# Patient Record
Sex: Female | Born: 1975 | Race: White | Hispanic: No | Marital: Single | Smoking: Current every day smoker
Health system: Southern US, Community
[De-identification: ages and names within clinical notes are randomized; demographics above are authoritative.]

## PROBLEM LIST (undated history)

## (undated) HISTORY — PX: KNEE SURGERY: SHX244

---

## 2006-10-04 ENCOUNTER — Emergency Department: Payer: Self-pay | Admitting: Emergency Medicine

## 2007-08-29 ENCOUNTER — Other Ambulatory Visit: Payer: Self-pay

## 2007-08-29 ENCOUNTER — Emergency Department: Payer: Self-pay | Admitting: Emergency Medicine

## 2008-07-30 ENCOUNTER — Ambulatory Visit: Payer: Self-pay | Admitting: Physician Assistant

## 2008-08-27 ENCOUNTER — Ambulatory Visit: Payer: Self-pay | Admitting: Orthopedic Surgery

## 2008-09-03 ENCOUNTER — Ambulatory Visit: Payer: Self-pay | Admitting: Orthopedic Surgery

## 2009-05-16 ENCOUNTER — Ambulatory Visit: Payer: Self-pay | Admitting: Unknown Physician Specialty

## 2009-07-02 ENCOUNTER — Ambulatory Visit: Payer: Self-pay

## 2011-12-25 ENCOUNTER — Ambulatory Visit: Payer: Self-pay | Admitting: Physician Assistant

## 2011-12-25 LAB — URINALYSIS, COMPLETE
Glucose,UR: NEGATIVE mg/dL (ref 0–75)
Ketone: NEGATIVE
Specific Gravity: 1.015 (ref 1.003–1.030)

## 2011-12-27 LAB — URINE CULTURE

## 2012-12-04 ENCOUNTER — Ambulatory Visit: Payer: Self-pay | Admitting: Podiatry

## 2013-11-27 ENCOUNTER — Ambulatory Visit: Payer: Self-pay | Admitting: Emergency Medicine

## 2013-11-29 ENCOUNTER — Ambulatory Visit: Payer: Self-pay | Admitting: Emergency Medicine

## 2013-12-04 LAB — WOUND CULTURE

## 2015-12-10 ENCOUNTER — Ambulatory Visit
Admission: EM | Admit: 2015-12-10 | Discharge: 2015-12-10 | Disposition: A | Discharge status: Home / Self Care | Payer: Self-pay | Attending: Family Medicine | Admitting: Family Medicine

## 2015-12-10 ENCOUNTER — Ambulatory Visit (INDEPENDENT_AMBULATORY_CARE_PROVIDER_SITE_OTHER): Payer: Self-pay

## 2015-12-10 ENCOUNTER — Encounter: Payer: Self-pay | Admitting: *Deleted

## 2015-12-10 DIAGNOSIS — S93602A Unspecified sprain of left foot, initial encounter: Secondary | ICD-10-CM

## 2015-12-10 DIAGNOSIS — S93402A Sprain of unspecified ligament of left ankle, initial encounter: Secondary | ICD-10-CM

## 2015-12-10 NOTE — ED Triage Notes (Signed)
Patient was running this AM during a exercise class and rolled her left ankle.

## 2015-12-10 NOTE — ED Provider Notes (Signed)
[facility] ____________________________________________  Time seen: Approximately 8:17 PM  I have reviewed the triage vital signs and the nursing notes.   HISTORY  Chief Complaint Foot Injury (left)   HPI Hannah Mayo is a 40 y.o. female presents for complaint of left foot and ankle pain. Patient reports that today she was riding and rolled her foot, proceeded to go to the boot camp Class with a lot of repetitive movements. Reports this evening she began having pain. Patient reports she has remained active all day, and reports pain started as soon as she actually rested her foot. Reports minimal pain at rest, majority of pain was with weightbearing and active walking. Reports had crutches at home and has been using those this evening. Denies any pain radiation, paresthesias, other injury. Denies fall to the ground, head injury or loss consciousness. Denies any other complaints. Denies recent sickness. Denies history of similar in left foot in the past.  No LMP recorded. Patient has had an injection. Declines pregnancy.  History reviewed. No pertinent past medical history.  There are no active problems to display for this patient.   Past Surgical History:  Procedure Laterality Date  . KNEE SURGERY Right       No current facility-administered medications for this encounter.  No current outpatient prescriptions on file.  Allergies Bee venom and Sulfa antibiotics  History reviewed. No pertinent family history.  Social History Social History  Substance Use Topics  . Smoking status: Current Every Day Smoker  . Smokeless tobacco: Never Used  . Alcohol use No    Review of Systems Constitutional: No fever/chills Eyes: No visual changes. ENT: No sore throat. Cardiovascular: Denies chest pain. Respiratory: Denies shortness of breath. Gastrointestinal: No abdominal pain.  No nausea, no vomiting.  No diarrhea.  No constipation. Genitourinary: Negative for  dysuria. Musculoskeletal: Negative for back pain. As above.  Skin: Negative for rash. Neurological: Negative for headaches, focal weakness or numbness.  10-point ROS otherwise negative.  ____________________________________________   PHYSICAL EXAM:  VITAL SIGNS: ED Triage Vitals  Enc Vitals Group     BP 12/10/15 1933 135/85     Pulse Rate 12/10/15 1933 92     Resp 12/10/15 1933 16     Temp 12/10/15 1933 98.1 F (36.7 C)     Temp Source 12/10/15 1933 Oral     SpO2 12/10/15 1933 100 %     Weight 12/10/15 1934 150 lb (68 kg)     Height 12/10/15 1934 5\' 6"  (1.676 m)     Head Circumference --      Peak Flow --      Pain Score 12/10/15 1938 7     Pain Loc --      Pain Edu? --      Excl. in GC? --     Constitutional: Alert and oriented. Well appearing and in no acute distress. Eyes: Conjunctivae are normal. PERRL. EOMI. ENT      Head: Normocephalic and atraumatic.      Mouth/Throat: Mucous membranes are moist. Cardiovascular: Normal rate, regular rhythm. Grossly normal heart sounds.  Good peripheral circulation. Respiratory: Normal respiratory effort without tachypnea nor retractions. Breath sounds are clear and equal bilaterally. No wheezes/rales/rhonchi. Gastrointestinal: Soft and nontender.  Musculoskeletal:No midline cervical, thoracic or lumbar tenderness to palpation. Bilateral pedal pulses equal and easily palpated.      Right lower leg:  No tenderness or edema.      Left lower leg:  No tenderness or edema. Except:  Left lateral malleolus mild tenderness to direct palpation, left anterior ankle mild tenderness to palpation dorsal mid to medial foot mild to moderate tenderness to palpation, no erythema, no edema, no ecchymosis, full range of motion, mild pain with left ankle rotation as well as plantar flexion and dosiflexion, normal sensation, normal distal capillary refill. No motor or tendon deficits to left foot. Left lower extremity otherwise nontender. Neurologic:   Normal speech and language. No gross focal neurologic deficits are appreciated. Speech is normal. No gait instability.  Skin:  Skin is warm, dry and intact. No rash noted. Psychiatric: Mood and affect are normal. Speech and behavior are normal. Patient exhibits appropriate insight and judgment   ___________________________________________   LABS (all labs ordered are listed, but only abnormal results are displayed)  Labs Reviewed - No data to display ____________________________________________  RADIOLOGY  Dg Ankle Complete Left  Result Date: 12/10/2015 CLINICAL DATA:  Ankle pain status post injury. Rolled ankle earlier today. EXAM: LEFT ANKLE COMPLETE - 3+ VIEW COMPARISON:  None. FINDINGS: There is soft tissue swelling adjacent to the lateral malleolus. The ankle is located. No acute fracture is identified. IMPRESSION: Soft tissue swelling adjacent to the lateral malleolus. No acute fracture identified. Electronically Signed   By: Britta MccreedySusan  Turner M.D.   On: 12/10/2015 20:22   Dg Foot Complete Left  Result Date: 12/10/2015 CLINICAL DATA:  Left foot injury. Dorsal and medial foot pain. Initial encounter. EXAM: LEFT FOOT - COMPLETE 3+ VIEW COMPARISON:  07/17/2012 FINDINGS: There is no evidence of fracture or dislocation. There is no evidence of arthropathy or other focal bone abnormality. Soft tissues are unremarkable. IMPRESSION: Negative. Electronically Signed   By: Myles RosenthalJohn  Stahl M.D.   On: 12/10/2015 20:22   ____________________________________________   PROCEDURES Procedures   Ace wrap applied by RN. ____________________________________________   INITIAL IMPRESSION / ASSESSMENT AND PLAN / ED COURSE  Pertinent labs & imaging results that were available during my care of the patient were reviewed by me and considered in my medical decision making (see chart for details).  Well-appearing patient. No acute distress. Presents for complaints of left ankle and left foot pain post  mechanical injury earlier today. Denies any pain or injury. Left foot and left ankle x-ray negative per radiology for acute bony abnormality. Suspect sprain injury. Encouraged supportive care. Ace wrap applied by RN. Patient has crutches at home. Patient declined need for prescription medication, and reports she'll take home ibuprofen. Information given for orthopedic as needed for continued pain. Declines need for work note.  Discussed follow up with Primary care physician this week. Discussed follow up and return parameters including no resolution or any worsening concerns. Patient verbalized understanding and agreed to plan.   ____________________________________________   FINAL CLINICAL IMPRESSION(S) / ED DIAGNOSES  Final diagnoses:  Sprain of left ankle, unspecified ligament, initial encounter  Foot sprain, left, initial encounter     There are no discharge medications for this patient.   Note: This dictation was prepared with Dragon dictation along with smaller phrase technology. Any transcriptional errors that result from this process are unintentional.    Clinical Course     Renford DillsLindsey Haila Dena, NP 12/10/15 2049

## 2015-12-10 NOTE — Discharge Instructions (Signed)
Rest, ice, elevate. Gradually apply weight as tolerated.  Follow up with your primary care physician this week as needed. Return to Urgent care for new or worsening concerns.

## 2016-05-10 ENCOUNTER — Ambulatory Visit (INDEPENDENT_AMBULATORY_CARE_PROVIDER_SITE_OTHER): Payer: Self-pay

## 2016-05-10 DIAGNOSIS — Z3042 Encounter for surveillance of injectable contraceptive: Secondary | ICD-10-CM

## 2016-05-10 MED ORDER — MEDROXYPROGESTERONE ACETATE 150 MG/ML IM SUSP
150.0000 mg | Freq: Once | INTRAMUSCULAR | Status: AC
  Administered 2016-05-10: 150 mg via INTRAMUSCULAR

## 2016-08-05 ENCOUNTER — Telehealth: Payer: Self-pay

## 2016-08-05 NOTE — Telephone Encounter (Signed)
Pt needs a refill on depo, her appt for her shot is 08/06/16, I called pt to get her to schedule a annual no answer, and voice mail box full

## 2016-08-06 ENCOUNTER — Other Ambulatory Visit: Payer: Self-pay | Admitting: Advanced Practice Midwife

## 2016-08-06 ENCOUNTER — Ambulatory Visit (INDEPENDENT_AMBULATORY_CARE_PROVIDER_SITE_OTHER): Payer: Self-pay

## 2016-08-06 DIAGNOSIS — Z308 Encounter for other contraceptive management: Secondary | ICD-10-CM

## 2016-08-06 DIAGNOSIS — Z3042 Encounter for surveillance of injectable contraceptive: Secondary | ICD-10-CM

## 2016-08-06 MED ORDER — MEDROXYPROGESTERONE ACETATE 150 MG/ML IM SUSP: 150.0000 mg | Freq: Once | INTRAMUSCULAR | 0 refills | Status: DC

## 2016-08-06 MED ORDER — MEDROXYPROGESTERONE ACETATE 150 MG/ML IM SUSP
150.0000 mg | Freq: Once | INTRAMUSCULAR | Status: AC
  Administered 2016-08-06: 150 mg via INTRAMUSCULAR

## 2016-08-06 NOTE — Progress Notes (Signed)
Pt here for depo which was given IM right glut.  NDC# [address]-2 

## 2016-08-06 NOTE — Telephone Encounter (Signed)
Please call and schedule annual.

## 2016-08-06 NOTE — Telephone Encounter (Signed)
Called and left voicemail for patient to call to be reschedule.

## 2016-08-27 ENCOUNTER — Encounter: Payer: Self-pay | Admitting: Advanced Practice Midwife

## 2016-08-27 ENCOUNTER — Ambulatory Visit (INDEPENDENT_AMBULATORY_CARE_PROVIDER_SITE_OTHER): Payer: Self-pay | Admitting: Advanced Practice Midwife

## 2016-08-27 VITALS — BP 118/72 | Ht 66.0 in | Wt 152.0 lb

## 2016-08-27 DIAGNOSIS — Z3042 Encounter for surveillance of injectable contraceptive: Secondary | ICD-10-CM

## 2016-08-27 DIAGNOSIS — Z01419 Encounter for gynecological examination (general) (routine) without abnormal findings: Secondary | ICD-10-CM

## 2016-08-27 MED ORDER — MEDROXYPROGESTERONE ACETATE 150 MG/ML IM SUSP: 150.0000 mg | INTRAMUSCULAR | 3 refills | Status: DC

## 2016-08-27 NOTE — Progress Notes (Signed)
Patient ID: Hannah Mayo, female   DOB: 01/07/1976, 41 y.o.   MRN: [id number]     Gynecology Annual Exam  PCP: Patient, No Pcp Per  Chief Complaint:  Chief Complaint  Patient presents with  . Annual Exam    History of Present Illness: Patient is a 41 y.o. G0P0000 presents for annual exam. The patient has no complaints today. She is asked about readiness for smoking cessation and states that at this time she is not ready to quit.  LMP: No LMP recorded. Patient has had an injection. Average Interval: irregular, some spotting for 2-3 weeks prior to next injection Duration of flow: NA Heavy Menses: not applicable Clots: no Intermenstrual Bleeding: no Postcoital Bleeding: no Dysmenorrhea: no  The patient is sexually active. She currently uses Depo-Provera injections for contraception. She denies dyspareunia.  The patient does occasionally perform self breast exams.  There is no notable family history of breast or ovarian cancer in her family.  The patient wears seatbelts: yes.   The patient has regular exercise: yes.  She admits to healthy diet and adequate hydration. She is an 18 year 1/2 to 1 PPD cigarette smoker.  The patient denies current symptoms of depression.    Review of Systems: Review of Systems  Constitutional: Negative.   HENT: Negative.   Eyes: Negative.   Respiratory: Negative.   Cardiovascular: Negative.   Gastrointestinal: Negative.   Genitourinary: Negative.   Musculoskeletal: Negative.   Skin: Negative.   Neurological: Negative.   Endo/Heme/Allergies: Negative.   Psychiatric/Behavioral: Negative.     Past Medical History:  History reviewed. No pertinent past medical history.  Past Surgical History:  Past Surgical History:  Procedure Laterality Date  . KNEE SURGERY Right     Gynecologic History:  No LMP recorded. Patient has had an injection. Contraception: Depo-Provera injections Last Pap: Results were: no abnormalities   Obstetric History:  G0P0000  Family History:  Family History  Problem Relation Age of Onset  . Breast cancer Maternal Grandmother   . Bone cancer Maternal Grandmother     Social History:  Social History   Social History  . Marital status: Single    Spouse name: N/A  . Number of children: N/A  . Years of education: N/A   Occupational History  . Not on file.   Social History Main Topics  . Smoking status: Current Every Day Smoker  . Smokeless tobacco: Never Used  . Alcohol use No  . Drug use: No  . Sexual activity: Yes    Birth control/ protection: Injection   Other Topics Concern  . Not on file   Social History Narrative  . No narrative on file    Allergies:  Allergies  Allergen Reactions  . Bee Venom Anaphylaxis  . Sulfa Antibiotics Hives    Medications: Prior to Admission medications   Medication Sig Start Date End Date Taking? Authorizing Provider  medroxyPROGESTERone (DEPO-PROVERA) 150 MG/ML injection Inject 1 mL (150 mg total) into the muscle every 3 (three) months. 08/27/16   Tresea MallGledhill, Seynabou Fults, CNM    Physical Exam Vitals: Blood pressure 118/72, height 5\' 6"  (1.676 m), weight 152 lb (68.9 kg).  General: NAD HEENT: normocephalic, anicteric Thyroid: no enlargement, no palpable nodules Pulmonary: No increased work of breathing, CTAB Cardiovascular: RRR, distal pulses 2+ Breast: Breast symmetrical, no tenderness, no palpable nodules or masses, no skin or nipple retraction present, no nipple discharge.  No axillary or supraclavicular lymphadenopathy. Abdomen: NABS, soft, non-tender, non-distended.  Umbilicus without lesions.  No hepatomegaly, splenomegaly or masses palpable. No evidence of hernia  Genitourinary: Deferred for PAP screening interval and no gyn concerns Extremities: no edema, erythema, or tenderness Neurologic: Grossly intact Psychiatric: mood appropriate, affect full   Assessment: 41 y.o. G0P0000 Well Woman exam  Plan: Problem List Items Addressed This Visit     None    Visit Diagnoses    Well woman exam with routine gynecological exam    -  Primary   Encounter for surveillance of injectable contraceptive       Relevant Medications   medroxyPROGESTERone (DEPO-PROVERA) 150 MG/ML injection      1) STI screening was offered and declined  2) ASCCP guidelines and rational discussed.  Patient opts for every 3 years screening interval  3) Contraception - patient chooses to continue with Depo Provera  4) Routine healthcare maintenance including cholesterol, diabetes screening discussed managed by PCP  5) Follow up 1 year for routine annual exam   Tresea Mall, CNM

## 2016-10-26 ENCOUNTER — Other Ambulatory Visit: Payer: Self-pay | Admitting: Advanced Practice Midwife

## 2016-10-26 DIAGNOSIS — Z3042 Encounter for surveillance of injectable contraceptive: Secondary | ICD-10-CM

## 2016-11-09 ENCOUNTER — Other Ambulatory Visit: Payer: Self-pay | Admitting: Advanced Practice Midwife

## 2016-11-09 DIAGNOSIS — Z3042 Encounter for surveillance of injectable contraceptive: Secondary | ICD-10-CM

## 2016-11-11 NOTE — Telephone Encounter (Signed)
Please advise 

## 2016-11-11 NOTE — Telephone Encounter (Signed)
Refill sent.

## 2016-11-17 ENCOUNTER — Ambulatory Visit (INDEPENDENT_AMBULATORY_CARE_PROVIDER_SITE_OTHER): Payer: Self-pay

## 2016-11-17 DIAGNOSIS — Z3042 Encounter for surveillance of injectable contraceptive: Secondary | ICD-10-CM

## 2016-11-17 LAB — POCT URINE PREGNANCY: Preg Test, Ur: NEGATIVE

## 2016-11-17 MED ORDER — MEDROXYPROGESTERONE ACETATE 150 MG/ML IM SUSP
150.0000 mg | Freq: Once | INTRAMUSCULAR | Status: AC
  Administered 2016-11-17: 150 mg via INTRAMUSCULAR

## 2016-11-17 NOTE — Progress Notes (Unsigned)
Patient out of window for depo provera, urine pregnancy test negative.

## 2017-02-11 ENCOUNTER — Ambulatory Visit (INDEPENDENT_AMBULATORY_CARE_PROVIDER_SITE_OTHER): Payer: Self-pay

## 2017-02-11 DIAGNOSIS — Z3042 Encounter for surveillance of injectable contraceptive: Secondary | ICD-10-CM

## 2017-02-11 MED ORDER — MEDROXYPROGESTERONE ACETATE 150 MG/ML IM SUSP
150.0000 mg | Freq: Once | INTRAMUSCULAR | Status: AC
  Administered 2017-02-11: 150 mg via INTRAMUSCULAR

## 2017-03-07 ENCOUNTER — Encounter: Payer: Self-pay | Admitting: *Deleted

## 2017-03-07 ENCOUNTER — Ambulatory Visit
Admission: EM | Admit: 2017-03-07 | Discharge: 2017-03-07 | Disposition: A | Discharge status: Home / Self Care | Payer: Self-pay | Attending: Family Medicine | Admitting: Family Medicine

## 2017-03-07 DIAGNOSIS — B349 Viral infection, unspecified: Secondary | ICD-10-CM

## 2017-03-07 DIAGNOSIS — E0789 Other specified disorders of thyroid: Secondary | ICD-10-CM

## 2017-03-07 DIAGNOSIS — J029 Acute pharyngitis, unspecified: Secondary | ICD-10-CM

## 2017-03-07 LAB — RAPID STREP SCREEN (MED CTR MEBANE ONLY): STREPTOCOCCUS, GROUP A SCREEN (DIRECT): NEGATIVE

## 2017-03-07 NOTE — ED Triage Notes (Signed)
Headache, sore throat, stiff neck x1 week.

## 2017-03-07 NOTE — ED Provider Notes (Signed)
[facility]    CSN: [id number] Arrival date & time: 03/07/17  1006     History   Chief Complaint Chief Complaint  Patient presents with  . Sore Throat  . Headache  . Torticollis    HPI Hannah Mayo is a 42 y.o. female.   The history is provided by the patient.  URI  Presenting symptoms: fever and sore throat   Presenting symptoms: no congestion, no cough, no ear pain and no facial pain   Severity:  Moderate Onset quality:  Sudden Duration:  1 week Timing:  Constant Progression:  Unchanged Chronicity:  New Relieved by:  OTC medications Associated symptoms: headaches and neck pain   Associated symptoms: no wheezing   Risk factors: sick contacts   Risk factors: not elderly, no chronic cardiac disease, no chronic kidney disease, no chronic respiratory disease, no diabetes mellitus, no immunosuppression, no recent illness and no recent travel     History reviewed. No pertinent past medical history.  There are no active problems to display for this patient.   Past Surgical History:  Procedure Laterality Date  . KNEE SURGERY Right     OB History    Gravida Para Term Preterm AB Living   0 0 0 0 0 0   SAB TAB Ectopic Multiple Live Births   0 0 0 0 0       Home Medications    Prior to Admission medications   Medication Sig Start Date End Date Taking? Authorizing Provider  medroxyPROGESTERone (DEPO-PROVERA) 150 MG/ML injection Inject 1 mL (150 mg total) into the muscle every 3 (three) months. 08/27/16  Yes Tresea Mall, CNM  MedroxyPROGESTERone Acetate 150 MG/ML SUSY Inject 1 mL (150 mg total) into the muscle once. 11/11/16 11/11/16  Tresea Mall, CNM    Family History Family History  Problem Relation Age of Onset  . Healthy Mother   . Alcoholism Father   . CAD Father   . Breast cancer Maternal Grandmother   . Bone cancer Maternal Grandmother     Social History Social History   Tobacco Use  . Smoking status: Current Every Day Smoker    . Smokeless tobacco: Never Used  Substance Use Topics  . Alcohol use: No  . Drug use: No     Allergies   Bee venom and Sulfa antibiotics   Review of Systems Review of Systems  Constitutional: Positive for fever.  HENT: Positive for sore throat. Negative for congestion and ear pain.   Respiratory: Negative for cough and wheezing.   Musculoskeletal: Positive for neck pain.  Neurological: Positive for headaches.     Physical Exam Triage Vital Signs ED Triage Vitals  Enc Vitals Group     BP 03/07/17 1041 124/82     Pulse Rate 03/07/17 1041 (!) 101     Resp 03/07/17 1041 16     Temp 03/07/17 1041 98.7 F (37.1 C)     Temp Source 03/07/17 1041 Oral     SpO2 03/07/17 1041 100 %     Weight 03/07/17 1043 150 lb (68 kg)     Height 03/07/17 1043 5\' 6"  (1.676 m)     Head Circumference --      Peak Flow --      Pain Score --      Pain Loc --      Pain Edu? --      Excl. in GC? --    No data found.  Updated Vital Signs BP 124/82 (  BP Location: Left Arm)   Pulse (!) 101   Temp 98.7 F (37.1 C) (Oral)   Resp 16   Ht 5\' 6"  (1.676 m)   Wt 150 lb (68 kg)   SpO2 100%   BMI 24.21 kg/m   Visual Acuity Right Eye Distance:   Left Eye Distance:   Bilateral Distance:    Right Eye Near:   Left Eye Near:    Bilateral Near:     Physical Exam  Constitutional: She appears well-developed and well-nourished. No distress.  HENT:  Head: Normocephalic and atraumatic.  Right Ear: Tympanic membrane, external ear and ear canal normal.  Left Ear: Tympanic membrane, external ear and ear canal normal.  Nose: Mucosal edema and rhinorrhea present. No nose lacerations, sinus tenderness, nasal deformity, septal deviation or nasal septal hematoma. No epistaxis.  No foreign bodies. Right sinus exhibits maxillary sinus tenderness and frontal sinus tenderness. Left sinus exhibits maxillary sinus tenderness and frontal sinus tenderness.  Mouth/Throat: Uvula is midline and mucous membranes are  normal. Posterior oropharyngeal erythema present. No oropharyngeal exudate, posterior oropharyngeal edema or tonsillar abscesses. No tonsillar exudate.  Eyes: Conjunctivae and EOM are normal. Pupils are equal, round, and reactive to light. Right eye exhibits no discharge. Left eye exhibits no discharge. No scleral icterus.  Neck: Normal range of motion. Neck supple. No tracheal deviation present. Thyromegaly (slightly more enlarged on right side) present.  Cardiovascular: Normal rate, regular rhythm and normal heart sounds.  Pulmonary/Chest: Effort normal and breath sounds normal. No respiratory distress. She has no wheezes. She has no rales.  Lymphadenopathy:    She has no cervical adenopathy.  Skin: She is not diaphoretic.  Nursing note and vitals reviewed.    UC Treatments / Results  Labs (all labs ordered are listed, but only abnormal results are displayed) Labs Reviewed  RAPID STREP [facility]CREEN (NOT [facility])  CULTURE, GROUP A STREP [facility])    EKG  EKG Interpretation None       Radiology No results found.  Procedures Procedures (including critical care time)  Medications Ordered in UC Medications - No data to display   Initial Impression / Assessment and Plan / UC Course  I have reviewed the triage vital signs and the nursing notes.  Pertinent labs & imaging results that were available during my care of the patient were reviewed by me and considered in my medical decision making (see chart for details).       Final Clinical Impressions(s) / UC Diagnoses   Final diagnoses:  Viral pharyngitis  Thyroid fullness    ED Discharge Orders    None     1. Lab results and diagnosis reviewed with patient 2. Recommend supportive treatment with salt water gargles, otc analgesics prn for sore throat 3. Recommend patient follow up with PCP for recheck of thyroid and possible referral for further thyroid evaluation 4. Follow-up prn if symptoms worsen or don't  improve   Controlled Substance Prescriptions [place] Controlled Substance Registry consulted? Not Applicable   Payton Mccallumonty, Jeffey Janssen, MD 03/07/17 [phone]

## 2017-03-10 LAB — CULTURE, GROUP A STREP (THRC)

## 2017-05-04 ENCOUNTER — Ambulatory Visit (INDEPENDENT_AMBULATORY_CARE_PROVIDER_SITE_OTHER): Payer: Self-pay

## 2017-05-04 DIAGNOSIS — Z3042 Encounter for surveillance of injectable contraceptive: Secondary | ICD-10-CM

## 2017-05-04 MED ORDER — MEDROXYPROGESTERONE ACETATE 150 MG/ML IM SUSP
150.0000 mg | Freq: Once | INTRAMUSCULAR | Status: AC
  Administered 2017-05-04: 150 mg via INTRAMUSCULAR

## 2017-07-22 ENCOUNTER — Other Ambulatory Visit: Payer: Self-pay | Admitting: Advanced Practice Midwife

## 2017-07-22 DIAGNOSIS — Z3042 Encounter for surveillance of injectable contraceptive: Secondary | ICD-10-CM

## 2017-07-27 ENCOUNTER — Ambulatory Visit: Payer: Self-pay

## 2017-07-27 DIAGNOSIS — Z3042 Encounter for surveillance of injectable contraceptive: Secondary | ICD-10-CM

## 2017-07-27 MED ORDER — MEDROXYPROGESTERONE ACETATE 150 MG/ML IM SUSP
150.0000 mg | Freq: Once | INTRAMUSCULAR | Status: AC
  Administered 2017-07-27: 150 mg via INTRAMUSCULAR

## 2017-10-19 ENCOUNTER — Ambulatory Visit: Payer: Self-pay

## 2017-10-19 DIAGNOSIS — Z3042 Encounter for surveillance of injectable contraceptive: Secondary | ICD-10-CM

## 2017-10-19 MED ORDER — MEDROXYPROGESTERONE ACETATE 150 MG/ML IM SUSP
150.0000 mg | Freq: Once | INTRAMUSCULAR | Status: AC
  Administered 2017-10-19: 150 mg via INTRAMUSCULAR

## 2018-01-18 ENCOUNTER — Ambulatory Visit (INDEPENDENT_AMBULATORY_CARE_PROVIDER_SITE_OTHER): Payer: Self-pay

## 2018-01-18 DIAGNOSIS — Z3042 Encounter for surveillance of injectable contraceptive: Secondary | ICD-10-CM

## 2018-01-18 MED ORDER — MEDROXYPROGESTERONE ACETATE 150 MG/ML IM SUSP
150.0000 mg | Freq: Once | INTRAMUSCULAR | Status: AC
  Administered 2018-01-18: 150 mg via INTRAMUSCULAR

## 2018-04-04 ENCOUNTER — Other Ambulatory Visit: Payer: Self-pay | Admitting: Advanced Practice Midwife

## 2018-04-04 DIAGNOSIS — Z3042 Encounter for surveillance of injectable contraceptive: Secondary | ICD-10-CM

## 2018-04-19 ENCOUNTER — Other Ambulatory Visit: Payer: Self-pay

## 2018-04-19 ENCOUNTER — Ambulatory Visit (INDEPENDENT_AMBULATORY_CARE_PROVIDER_SITE_OTHER): Payer: Self-pay

## 2018-04-19 DIAGNOSIS — Z3042 Encounter for surveillance of injectable contraceptive: Secondary | ICD-10-CM

## 2018-04-19 MED ORDER — MEDROXYPROGESTERONE ACETATE 150 MG/ML IM SUSP
150.0000 mg | Freq: Once | INTRAMUSCULAR | Status: AC
  Administered 2018-04-19: 150 mg via INTRAMUSCULAR

## 2018-07-05 ENCOUNTER — Other Ambulatory Visit: Payer: Self-pay | Admitting: Advanced Practice Midwife

## 2018-07-05 DIAGNOSIS — Z3042 Encounter for surveillance of injectable contraceptive: Secondary | ICD-10-CM

## 2018-07-12 ENCOUNTER — Other Ambulatory Visit: Payer: Self-pay

## 2018-07-12 ENCOUNTER — Ambulatory Visit (INDEPENDENT_AMBULATORY_CARE_PROVIDER_SITE_OTHER): Payer: Self-pay

## 2018-07-12 DIAGNOSIS — Z3042 Encounter for surveillance of injectable contraceptive: Secondary | ICD-10-CM

## 2018-07-12 MED ORDER — MEDROXYPROGESTERONE ACETATE 150 MG/ML IM SUSP
150.0000 mg | Freq: Once | INTRAMUSCULAR | Status: AC
  Administered 2018-07-12: 150 mg via INTRAMUSCULAR

## 2018-07-12 NOTE — Progress Notes (Signed)
Patient presents today for Depo Provera injection within dates. Given IM LUOQ. Patient tolerated well. 

## 2018-10-02 ENCOUNTER — Other Ambulatory Visit: Payer: Self-pay | Admitting: Advanced Practice Midwife

## 2018-10-02 DIAGNOSIS — Z3042 Encounter for surveillance of injectable contraceptive: Secondary | ICD-10-CM

## 2018-10-03 ENCOUNTER — Telehealth: Payer: Self-pay

## 2018-10-03 NOTE — Telephone Encounter (Signed)
Pt called triage asking for a refill on her depo shot. I called the pt to advise her that we cannot refill her depo shot because she has not been seen for her annual since 09/04/16 and legally we are not allowed to refill her rx unless we see her since it has been so long. I have spoke with front desk, [place] and AMS to confirm that and they all sated we cannot send more depo until she comes in for her annual. I left a VM for the pt to call us back and schedule her annual and stated that we will refill the rx only if she has an annual scheduled but we cannot refill again if she does not show for that appt.

## 2020-05-09 ENCOUNTER — Encounter: Payer: Self-pay | Admitting: Emergency Medicine

## 2020-05-09 ENCOUNTER — Other Ambulatory Visit: Payer: Self-pay

## 2020-05-09 ENCOUNTER — Ambulatory Visit
Admission: EM | Admit: 2020-05-09 | Discharge: 2020-05-09 | Disposition: A | Discharge status: Home / Self Care | Payer: Self-pay | Attending: Physician Assistant | Admitting: Physician Assistant

## 2020-05-09 ENCOUNTER — Ambulatory Visit (INDEPENDENT_AMBULATORY_CARE_PROVIDER_SITE_OTHER): Payer: Self-pay

## 2020-05-09 DIAGNOSIS — M25512 Pain in left shoulder: Secondary | ICD-10-CM

## 2020-05-09 DIAGNOSIS — R0789 Other chest pain: Secondary | ICD-10-CM

## 2020-05-09 MED ORDER — CYCLOBENZAPRINE HCL 10 MG PO TABS: 10.0000 mg | ORAL_TABLET | Freq: Two times a day (BID) | ORAL | 0 refills | Status: AC | PRN

## 2020-05-09 MED ORDER — MELOXICAM 7.5 MG PO TABS: 15.0000 mg | ORAL_TABLET | Freq: Every day | ORAL | 0 refills | Status: AC

## 2020-05-09 NOTE — ED Provider Notes (Addendum)
[facility]    CSN: [id number] Arrival date & time: 05/09/20  0858      History   Chief Complaint Chief Complaint  Patient presents with  . 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  Today, she is presenting for left upper chest pain and left anterior as well as scapular chest pain x 2 weeks.  These areas are tender to touch.  She does have good range of motion of her shoulder without pain.  She says pain is aching and occasionally sharp. Not associated with increased pain on breathing, movement, or eating. Nothing makes pain worse.  Pain improves with NSAIDs and TENS unit. She says symptoms have improved a little over the past 2 days since using her TENS unit.  Patient denies ever having any specific injury.  She says she has a history of allover joint pains and has not received a diagnosis.  She believes she may have Ehlers-Danlos syndrome since her sister does, but she has not been formally diagnosed.  Patient denies any history of cardiopulmonary disease.  She denies any red flag signs or symptoms.  She denies fever, dizziness, weakness, syncope or presyncope, palpitations, shortness of breath or breathing difficulty, abdominal pain, nausea/vomiting.  Denies any recent sickness.  No cough, congestion or sore throat.  No other concerns.  HPI  History reviewed. No pertinent past medical history.  There are no problems to display for this patient.   Past Surgical History:  Procedure Laterality Date  . KNEE SURGERY Right     OB History    Gravida  0   Para  0   Term  0   Preterm  0   AB  0   Living  0     SAB  0   IAB  0   Ectopic  0   Multiple  0   Live Births  0            Home Medications    Prior to Admission medications   Medication Sig Start Date End Date Taking? Authorizing Provider  cyclobenzaprine (FLEXERIL) 10 MG tablet Take 1 tablet (10 mg total) by mouth 2 (two) times daily as needed for up to  10 days for muscle spasms. 05/09/20 05/19/20 Yes Shirlee LatchEaves, Jazlyn Tippens B, PA-C  meloxicam (MOBIC) 7.5 MG tablet Take 2 tablets (15 mg total) by mouth daily. 05/09/20 06/08/20 Yes Shirlee LatchEaves, Lauramae Kneisley B, PA-C  medroxyPROGESTERone Acetate 150 MG/ML SUSY INJECT 1 ML (150 MG TOTAL) INTO THE MUSCLE ONCE. (NEED ANNUAL EXAM PRIOR TO ANY FURTHER REFILLS) 07/05/18   Tresea MallGledhill, Jane, CNM    Family History Family History  Problem Relation Age of Onset  . Healthy Mother   . Hypertension Mother   . Alcoholism Father   . CAD Father   . Breast cancer Maternal Grandmother   . Bone cancer Maternal Grandmother     Social History Social History   Tobacco Use  . Smoking status: Current Every Day Smoker    Types: Cigarettes  . Smokeless tobacco: Never Used  Vaping Use  . Vaping Use: Never used  Substance Use Topics  . Alcohol use: No  . Drug use: No     Allergies   Bee venom and Sulfa antibiotics   Review of Systems Review of Systems  Constitutional: Negative for fatigue.  HENT: Negative for congestion, rhinorrhea and sore throat.   Respiratory: Negative for cough.   Cardiovascular: Positive for chest  pain.  Gastrointestinal: Negative for abdominal pain, nausea and vomiting.  Musculoskeletal: Positive for arthralgias. Negative for back pain, gait problem, joint swelling, myalgias, neck pain and neck stiffness.  Skin: Negative for rash.  Neurological: Negative for dizziness, weakness, numbness and headaches.     Physical Exam Triage Vital Signs ED Triage Vitals  Enc Vitals Group     BP 05/09/20 0910 125/87     Pulse Rate 05/09/20 0910 85     Resp 05/09/20 0910 14     Temp 05/09/20 0910 98.3 F (36.8 C)     Temp Source 05/09/20 0910 Oral     SpO2 05/09/20 0910 100 %     Weight 05/09/20 0907 147 lb (66.7 kg)     Height 05/09/20 0907 5\' 6"  (1.676 m)     Head Circumference --      Peak Flow --      Pain Score 05/09/20 0906 3     Pain Loc --      Pain Edu? --      Excl. in GC? --    No data  found.  Updated Vital Signs BP 125/87 (BP Location: Right Arm)   Pulse 85   Temp 98.3 F (36.8 C) (Oral)   Resp 14   Ht 5\' 6"  (1.676 m)   Wt 147 lb (66.7 kg)   LMP 04/18/2020 (Approximate)   SpO2 100%   BMI 23.73 kg/m       Physical Exam Vitals and nursing note reviewed.  Constitutional:      General: She is not in acute distress.    Appearance: Normal appearance. She is not ill-appearing, toxic-appearing or diaphoretic.  HENT:     Head: Normocephalic and atraumatic.     Nose: Nose normal.     Mouth/Throat:     Mouth: Mucous membranes are moist.     Pharynx: Oropharynx is clear.  Eyes:     General: No scleral icterus.       Right eye: No discharge.        Left eye: No discharge.     Conjunctiva/sclera: Conjunctivae normal.  Cardiovascular:     Rate and Rhythm: Normal rate and regular rhythm.     Heart sounds: Normal heart sounds.  Pulmonary:     Effort: Pulmonary effort is normal. No respiratory distress.     Breath sounds: Normal breath sounds. No wheezing, rhonchi or rales.  Chest:     Chest wall: Tenderness (TTP left superior pectoralis muscle) present.  Musculoskeletal:     Left shoulder: Tenderness (TTP anterior shoulder and biceps groove) present. No swelling or bony tenderness. Normal range of motion. Normal strength.     Cervical back: Normal range of motion and neck supple. No rigidity or tenderness.  Skin:    General: Skin is dry.  Neurological:     General: No focal deficit present.     Mental Status: She is alert. Mental status is at baseline.     Motor: No weakness.     Gait: Gait normal.  Psychiatric:        Mood and Affect: Mood normal.        Behavior: Behavior normal.        Thought Content: Thought content normal.      UC Treatments / Results  Labs (all labs ordered are listed, but only abnormal results are displayed) Labs Reviewed - No data to display  EKG   Radiology DG Chest 2 View  Result Date: 05/09/2020 CLINICAL DATA:  45 year old female left chest and shoulder pain for 2 weeks. EXAM: CHEST - 2 VIEW COMPARISON:  08/30/2007 FINDINGS: The mediastinal contours are within normal limits. No cardiomegaly. The lungs are clear bilaterally without evidence of focal consolidation, pleural effusion, or pneumothorax. No acute osseous abnormality. IMPRESSION: No acute cardiopulmonary process. Electronically Signed   By: Marliss Coots MD   On: 05/09/2020 09:49   DG Shoulder Left  Result Date: 05/09/2020 CLINICAL DATA:  45 year old female left shoulder pain for 2 weeks. EXAM: LEFT SHOULDER - 2+ VIEW COMPARISON:  None. FINDINGS: There is no evidence of fracture or dislocation. There is no evidence of arthropathy or other focal bone abnormality. Soft tissues are unremarkable. IMPRESSION: No acute fracture or malalignment. Electronically Signed   By: Marliss Coots MD   On: 05/09/2020 09:50    Procedures ED EKG  Date/Time: 05/09/2020 9:49 AM Performed by: Shirlee Latch, PA-C Authorized by: Shirlee Latch, PA-C   ECG reviewed by ED Physician in the absence of a cardiologist: yes   Previous ECG:    Previous ECG:  Unavailable Interpretation:    Interpretation: normal   Rate:    ECG rate:  85   ECG rate assessment: normal   Rhythm:    Rhythm: sinus rhythm   Ectopy:    Ectopy: none   QRS:    QRS axis:  Normal ST segments:    ST segments:  Normal T waves:    T waves: normal   Comments:     Normal sinus rhythm. Regular rate.   (including critical care time)  Medications Ordered in UC Medications - No data to display  Initial Impression / Assessment and Plan / UC Course  I have reviewed the triage vital signs and the nursing notes.  Pertinent labs & imaging results that were available during my care of the patient were reviewed by me and considered in my medical decision making (see chart for details).   45 y/o female presenting for 2-week history of atraumatic left-sided chest and shoulder pain.  The chest and  shoulder are tender to palpation.  Pain has improved in the last 2 days.  No reduced range of motion of the shoulder.  Chest pain not associated with breathing or eating.  No red flag signs or symptoms.  All vital signs are normal and stable.  She is overall well-appearing.  Exam significant for TTP of the chest and left shoulder.  Her chest is clear to auscultation and heart regular rate and rhythm.  EKG performed today shows normal sinus rhythm and regular rate.  Chest x-ray performed is within normal limits and so is the left shoulder x-ray.  I did independently interpret the EKG and review the chest x-ray and left shoulder x-ray.  Low risk and low concern for cardiopulmonary abnormality as clinical presentation is consistent with musculoskeletal chest and shoulder pain.  Treating at this time with meloxicam and cyclobenzaprine.  Encouraged her to continue her TENS unit and use of heat and ice for discomfort.  Advised to follow-up with PCP, Ortho or our department if not improving over the next 2 weeks.  I thoroughly reviewed ED red flag signs and symptoms as well as return precautions for chest pain.  Final Clinical Impressions(s) / UC Diagnoses   Final diagnoses:  Chest wall pain  Acute pain of left shoulder     Discharge Instructions     CHEST AND SHOULDER PAIN: Your EKG was normal.  I am not worried about a problem  with your heart.  The chest x-ray and shoulder x-ray were both normal.  No evidence of arthritis your shoulder and your heart size is normal and there is no apparent issue with your lungs based on the chest x-ray.   Your presentation is most consistent with musculoskeletal chest and shoulder pain.  I have stressed avoiding painful activities . Reviewed RICE guidelines. Use medications as directed, including NSAIDs. If no NSAIDs have been prescribed for you today, you may take Aleve or Motrin over the counter. May use Tylenol in between doses of NSAIDs.  Meloxicam is an NSAID  does not take any other NSAIDs with this.  Since you do have a history of acid reflux it is not a bad idea for you to take over-the-counter Tums, Zantac or Prilosec when you take this medication.  I have also sent a muscle relaxer for you to try.  This can make you little drowsy to do not take if you are going to drive or operate any heavy machinery.  Continue to use her TENS unit if you believe that is helping.  If no improvement in the next 1-2 weeks, f/u with PCP or return to our office for reexamination, and please feel free to call or return at any time for any questions or concerns you may have and we will be happy to help you!      CHEST PAIN RED FLAGS:  The main red flags to look out for that could mean a heart attack are; pain in the center of the chest, difficulty breathing, nausea, sweating, clammy skin, dizziness, pain radiating to the jaw/neck/arm, racing heart, etc. If any of those symptoms are present, you must immediately get to the ER.     ED Prescriptions    Medication Sig Dispense Auth. Provider   meloxicam (MOBIC) 7.5 MG tablet Take 2 tablets (15 mg total) by mouth daily. 60 tablet Eusebio Friendly B, PA-C   cyclobenzaprine (FLEXERIL) 10 MG tablet Take 1 tablet (10 mg total) by mouth 2 (two) times daily as needed for up to 10 days for muscle spasms. 20 tablet Gareth Morgan     PDMP not reviewed this encounter.   Shirlee Latch, PA-C 05/09/20 1016    Eusebio Friendly B, PA-C 05/09/20 1016

## 2020-05-09 NOTE — Discharge Instructions (Addendum)
CHEST AND SHOULDER PAIN: Your EKG was normal.  I am not worried about a problem with your heart.  The chest x-ray and shoulder x-ray were both normal.  No evidence of arthritis your shoulder and your heart size is normal and there is no apparent issue with your lungs based on the chest x-ray.   Your presentation is most consistent with musculoskeletal chest and shoulder pain.  I have stressed avoiding painful activities . Reviewed RICE guidelines. Use medications as directed, including NSAIDs. If no NSAIDs have been prescribed for you today, you may take Aleve or Motrin over the counter. May use Tylenol in between doses of NSAIDs.  Meloxicam is an NSAID does not take any other NSAIDs with this.  Since you do have a history of acid reflux it is not a bad idea for you to take over-the-counter Tums, Zantac or Prilosec when you take this medication.  I have also sent a muscle relaxer for you to try.  This can make you little drowsy to do not take if you are going to drive or operate any heavy machinery.  Continue to use her TENS unit if you believe that is helping.  If no improvement in the next 1-2 weeks, f/u with PCP or return to our office for reexamination, and please feel free to call or return at any time for any questions or concerns you may have and we will be happy to help you!      CHEST PAIN RED FLAGS:  The main red flags to look out for that could mean a heart attack are; pain in the center of the chest, difficulty breathing, nausea, sweating, clammy skin, dizziness, pain radiating to the jaw/neck/arm, racing heart, etc. If any of those symptoms are present, you must immediately get to the ER.

## 2020-05-09 NOTE — ED Triage Notes (Signed)
Patient c/o left upper chest pain and left shoulder pain for the past 2 weeks.  Patient denies any injury or fall.  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  Patient denies SOB.

## 2020-06-21 ENCOUNTER — Ambulatory Visit
Admission: EM | Admit: 2020-06-21 | Discharge: 2020-06-21 | Disposition: A | Discharge status: Home / Self Care | Payer: Self-pay | Attending: Physician Assistant | Admitting: Physician Assistant

## 2020-06-21 ENCOUNTER — Other Ambulatory Visit: Payer: Self-pay

## 2020-06-21 ENCOUNTER — Encounter: Payer: Self-pay | Admitting: Emergency Medicine

## 2020-06-21 DIAGNOSIS — M25512 Pain in left shoulder: Secondary | ICD-10-CM

## 2020-06-21 DIAGNOSIS — R0789 Other chest pain: Secondary | ICD-10-CM

## 2020-06-21 MED ORDER — PREDNISONE 10 MG PO TABS: ORAL_TABLET | ORAL | 0 refills | Status: DC

## 2020-06-21 MED ORDER — CYCLOBENZAPRINE HCL 10 MG PO TABS: 10.0000 mg | ORAL_TABLET | Freq: Two times a day (BID) | ORAL | 0 refills | Status: AC | PRN

## 2020-06-21 NOTE — ED Triage Notes (Addendum)
Patient c/o left sided chest pain that started over a month and has not gone away.  Patient denies SOB.  Patient denies injury or fall. Patient was seen on 05/09/20 at [facility] for this chest pain and had EKG and chest xray done.

## 2020-06-21 NOTE — Discharge Instructions (Signed)
Your EKG was normal again today.  Your exam and symptoms are consistent with what they were a month and a half ago.  Since you did have some improvement with the NSAIDs and muscle relaxers and they describe your pain, and still do some like a musculoskeletal problem.  Will try prednisone course at this time.  It is stronger than a nonsteroidal anti-inflammatory and should help.  I have also refilled the Flexeril.  I encourage you to follow-up with Ortho.  You can go to [facility] walk-in urgent care in [place] if you are not feeling any better after you finish the prednisone or if your symptoms worsen.  Also, consider following up with physical therapy.  Most offices do not require a referral.  Go to emergency department sooner for any severe acute worsening of symptoms.

## 2020-06-22 NOTE — ED Provider Notes (Signed)
[facility]    CSN: [id number] Arrival date & time: 06/21/20  1412      History   Chief Complaint Chief Complaint  Patient presents with  . Chest Pain    Left side    HPI Hannah Mayo is a 45 y.o. female returning to clinic today for 68-month history of left anterior chest pain and left shoulder pain.  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  Also had a normal EKG at that time.  Patient prescribed NSAIDs and a muscle relaxer.  She says that she has been taking those medications and using her TENS unit and symptoms do improve when she takes those but have not resolved.  Patient denies any worsening of symptoms and states that her pain is in the same place as it was before.  She says the pain is aching and occasionally sharp, as described before.  She says when she moves a certain way she can reproduce the pain.  She says that if she slouches and hunches forward that will sometimes increase the pain in her chest and shoulder.  Again, patient denies any specific injury.  Patient says that she has had a history of multiple joint pains in the past but never had it assessed.  Patient denies any red flag signs or symptoms that can.  She denies any palpitations, shortness of breath or difficulty breathing, dizziness, weakness, headaches, abdominal pain, nausea/vomiting.  No cough or congestion.  Patient does not have a PCP and thought to return here since she has not improved since her last visit.  HPI  History reviewed. No pertinent past medical history.  There are no problems to display for this patient.   Past Surgical History:  Procedure Laterality Date  . KNEE SURGERY Right     OB History    Gravida  0   Para  0   Term  0   Preterm  0   AB  0   Living  0     SAB  0   IAB  0   Ectopic  0   Multiple  0   Live Births  0            Home  Medications    Prior to Admission medications   Medication Sig Start Date End Date Taking? Authorizing Provider  cyclobenzaprine (FLEXERIL) 10 MG tablet Take 1 tablet (10 mg total) by mouth 2 (two) times daily as needed for up to 10 days for muscle spasms. 06/21/20 07/01/20 Yes Shirlee Latch, PA-C  predniSONE (DELTASONE) 10 MG tablet Take 6 tablets by mouth on the first day and decrease by 1 tablet daily until complete 06/21/20  Yes Eusebio Friendly B, PA-C  medroxyPROGESTERone Acetate 150 MG/ML SUSY INJECT 1 ML (150 MG TOTAL) INTO THE MUSCLE ONCE. (NEED ANNUAL EXAM PRIOR TO ANY FURTHER REFILLS) 07/05/18   Tresea Mall, CNM    Family History Family History  Problem Relation Age of Onset  . Healthy Mother   . Hypertension Mother   . Alcoholism Father   . CAD Father   . Breast cancer Maternal Grandmother   . Bone cancer Maternal Grandmother     Social History Social History   Tobacco Use  . Smoking status: Current Every Day Smoker    Types: Cigarettes  . Smokeless tobacco: Never Used  Vaping Use  .  Vaping Use: Never used  Substance Use Topics  . Alcohol use: No  . Drug use: No     Allergies   Bee venom and Sulfa antibiotics   Review of Systems Review of Systems  Constitutional: Negative for chills, diaphoresis, fatigue and fever.  HENT: Negative for congestion.   Respiratory: Negative for cough, shortness of breath and wheezing.   Cardiovascular: Positive for chest pain. Negative for palpitations and leg swelling.  Gastrointestinal: Negative for abdominal pain, nausea and vomiting.  Musculoskeletal: Positive for arthralgias and myalgias.  Skin: Negative for rash.  Neurological: Negative for dizziness, weakness and headaches.  Hematological: Negative for adenopathy.     Physical Exam Triage Vital Signs ED Triage Vitals  Enc Vitals Group     BP 06/21/20 1432 (!) 144/82     Pulse Rate 06/21/20 1432 89     Resp 06/21/20 1432 14     Temp 06/21/20 1432 98.7 F (37.1  C)     Temp Source 06/21/20 1432 Oral     SpO2 06/21/20 1432 100 %     Weight 06/21/20 1430 147 lb (66.7 kg)     Height 06/21/20 1430 5\' 6"  (1.676 m)     Head Circumference --      Peak Flow --      Pain Score 06/21/20 1429 3     Pain Loc --      Pain Edu? --      Excl. in GC? --    No data found.  Updated Vital Signs BP (!) 144/82 (BP Location: Left Arm)   Pulse 89   Temp 98.7 F (37.1 C) (Oral)   Resp 14   Ht 5\' 6"  (1.676 m)   Wt 147 lb (66.7 kg)   LMP 06/16/2020 (Approximate)   SpO2 100%   BMI 23.73 kg/m       Physical Exam Vitals and nursing note reviewed.  Constitutional:      General: She is not in acute distress.    Appearance: Normal appearance. She is not ill-appearing or toxic-appearing.  HENT:     Head: Normocephalic and atraumatic.  Eyes:     General: No scleral icterus.       Right eye: No discharge.        Left eye: No discharge.     Conjunctiva/sclera: Conjunctivae normal.  Cardiovascular:     Rate and Rhythm: Normal rate and regular rhythm.     Heart sounds: Normal heart sounds.  Pulmonary:     Effort: Pulmonary effort is normal. No respiratory distress.     Breath sounds: Normal breath sounds.  Chest:     Chest wall: Tenderness (TTP superior left pectoralis muscle and TTP anterior shoulder diffusely. Full ROM of left shoulder w/o pain) present.  Musculoskeletal:     Cervical back: Neck supple.  Skin:    General: Skin is dry.  Neurological:     General: No focal deficit present.     Mental Status: She is alert. Mental status is at baseline.     Motor: No weakness.     Gait: Gait normal.  Psychiatric:        Mood and Affect: Mood normal.        Behavior: Behavior normal.        Thought Content: Thought content normal.      UC Treatments / Results  Labs (all labs ordered are listed, but only abnormal results are displayed) Labs Reviewed - No data to display  EKG  Radiology No results found.  Procedures ED EKG  Date/Time:  06/22/2020 8:19 AM Performed by: Shirlee LatchEaves, Victory Dresden B, PA-C Authorized by: Shirlee LatchEaves, Vernel Langenderfer B, PA-C   ECG reviewed by ED Physician in the absence of a cardiologist: yes   Previous ECG:    Previous ECG:  Compared to current   Similarity:  No change Interpretation:    Interpretation: normal   Rate:    ECG rate:  82   ECG rate assessment: normal   Rhythm:    Rhythm: sinus rhythm   Ectopy:    Ectopy: none   QRS:    QRS axis:  Normal   QRS intervals:  Normal   QRS conduction: normal   ST segments:    ST segments:  Normal T waves:    T waves: normal   Comments:     Normal sinus rhythm, regular rate   (including critical care time)  Medications Ordered in UC Medications - No data to display  Initial Impression / Assessment and Plan / UC Course  I have reviewed the triage vital signs and the nursing notes.  Pertinent labs & imaging results that were available during my care of the patient were reviewed by me and considered in my medical decision making (see chart for details).   45 year old female returning for 3264-month history of left anterior chest and shoulder pain.  Patient seen previously in did have an EKG, chest x-ray and shoulder x-ray which were all normal.  Patient did have some improvement with NSAIDs and muscle relaxers but returns because she is still having symptoms.  No worsening of symptoms since her previous visit.  Vital signs are stable today.  Blood pressure slightly elevated at 144/82.  Another EKG was performed today which shows normal sinus rhythm and regular rate.  No changes from previous EKG.  Patient's clinical presentation/exam still consistent with musculoskeletal pain.  I have refilled the cyclobenzaprine.  We will try prednisone at this time.  Advised her she should ultimately follow-up with orthopedics and physical therapy for this.  Advised that she can contact most physical therapy offices and usually does not need a referral.  Advise she can go to [facility]  walk-in urgent care [place] for evaluation of not improved with the prednisone.  Encouraged her to continue with her TENS unit and use of heat or ice for the discomfort.  Thoroughly reviewed ED red flag signs and symptoms regarding chest pain with patient.   Final Clinical Impressions(s) / UC Diagnoses   Final diagnoses:  Chest wall pain  Acute pain of left shoulder     Discharge Instructions     Your EKG was normal again today.  Your exam and symptoms are consistent with what they were a month and a half ago.  Since you did have some improvement with the NSAIDs and muscle relaxers and they describe your pain, and still do some like a musculoskeletal problem.  Will try prednisone course at this time.  It is stronger than a nonsteroidal anti-inflammatory and should help.  I have also refilled the Flexeril.  I encourage you to follow-up with Ortho.  You can go to [facility] walk-in urgent care [place] if you are not feeling any better after you finish the prednisone or if your symptoms worsen.  Also, consider following up with physical therapy.  Most offices do not require a referral.  Go to emergency department sooner for any severe acute worsening of symptoms.    ED Prescriptions  Medication Sig Dispense Auth. Provider   predniSONE (DELTASONE) 10 MG tablet Take 6 tablets by mouth on the first day and decrease by 1 tablet daily until complete 21 tablet Eusebio Friendly B, PA-C   cyclobenzaprine (FLEXERIL) 10 MG tablet Take 1 tablet (10 mg total) by mouth 2 (two) times daily as needed for up to 10 days for muscle spasms. 20 tablet Gareth Morgan     PDMP not reviewed this encounter.   Shirlee Latch, PA-C 06/22/20 [phone]

## 2022-03-16 ENCOUNTER — Telehealth: Payer: Self-pay

## 2022-03-16 NOTE — Telephone Encounter (Signed)
Returned call to patient from VM regarding inquiry into LCS.  No answer and patient VM is full.  Patient is age 47 and would not be eligible for LCS LDCT.

## 2022-03-17 NOTE — Telephone Encounter (Signed)
Returned call to patient from new voicemail for LCS inquiry.  Explained to patient our guidelines for LCS are for patient's age 45 years and older, who also meet the pack years criteria for smoking history.  She would not be eligible for LDCT.  Patient states her PCP has recommended she call us.  Discussed that sometimes we get referrals that don't meet all the criteria due to there are multiple elements that need to be met in order to proceed with the LDCT.  Patient states she would be a self-pay but advised we are still bound by our LCS criteria to order a LDCT.  Suggested patient reach out to PCP who recommended the LDCT and they may be able to recommend an alternative CT scan for her if she wants to have this done. They would be her best source for recommending and ordering imaging or other testing.  Patient acknowledged understanding.

## 2022-05-28 IMAGING — CR DG CHEST 2V
2 series · 2 of 2 positions shown · non-contrast
Comparison: 08/30/2007

CLINICAL DATA: 44-year-old female left chest and shoulder pain for
2 weeks.

EXAM:
CHEST - 2 VIEW

[chest pa]
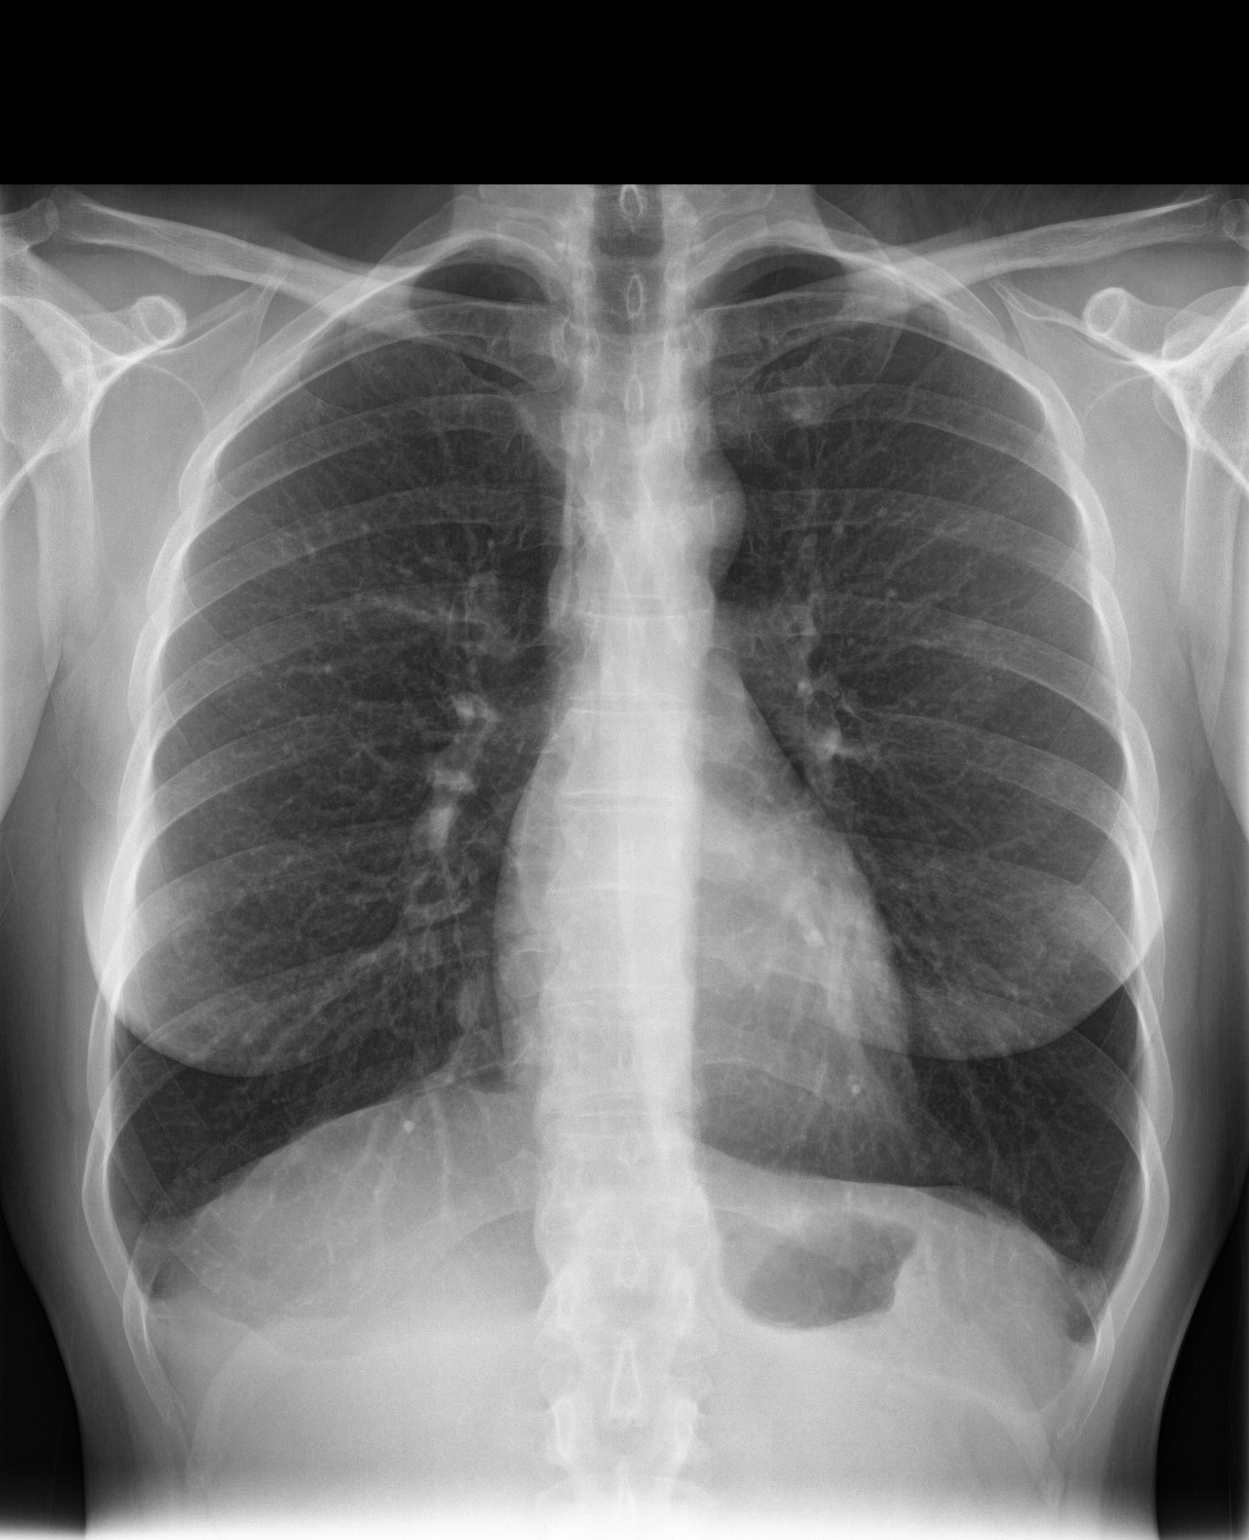

[chest lat]
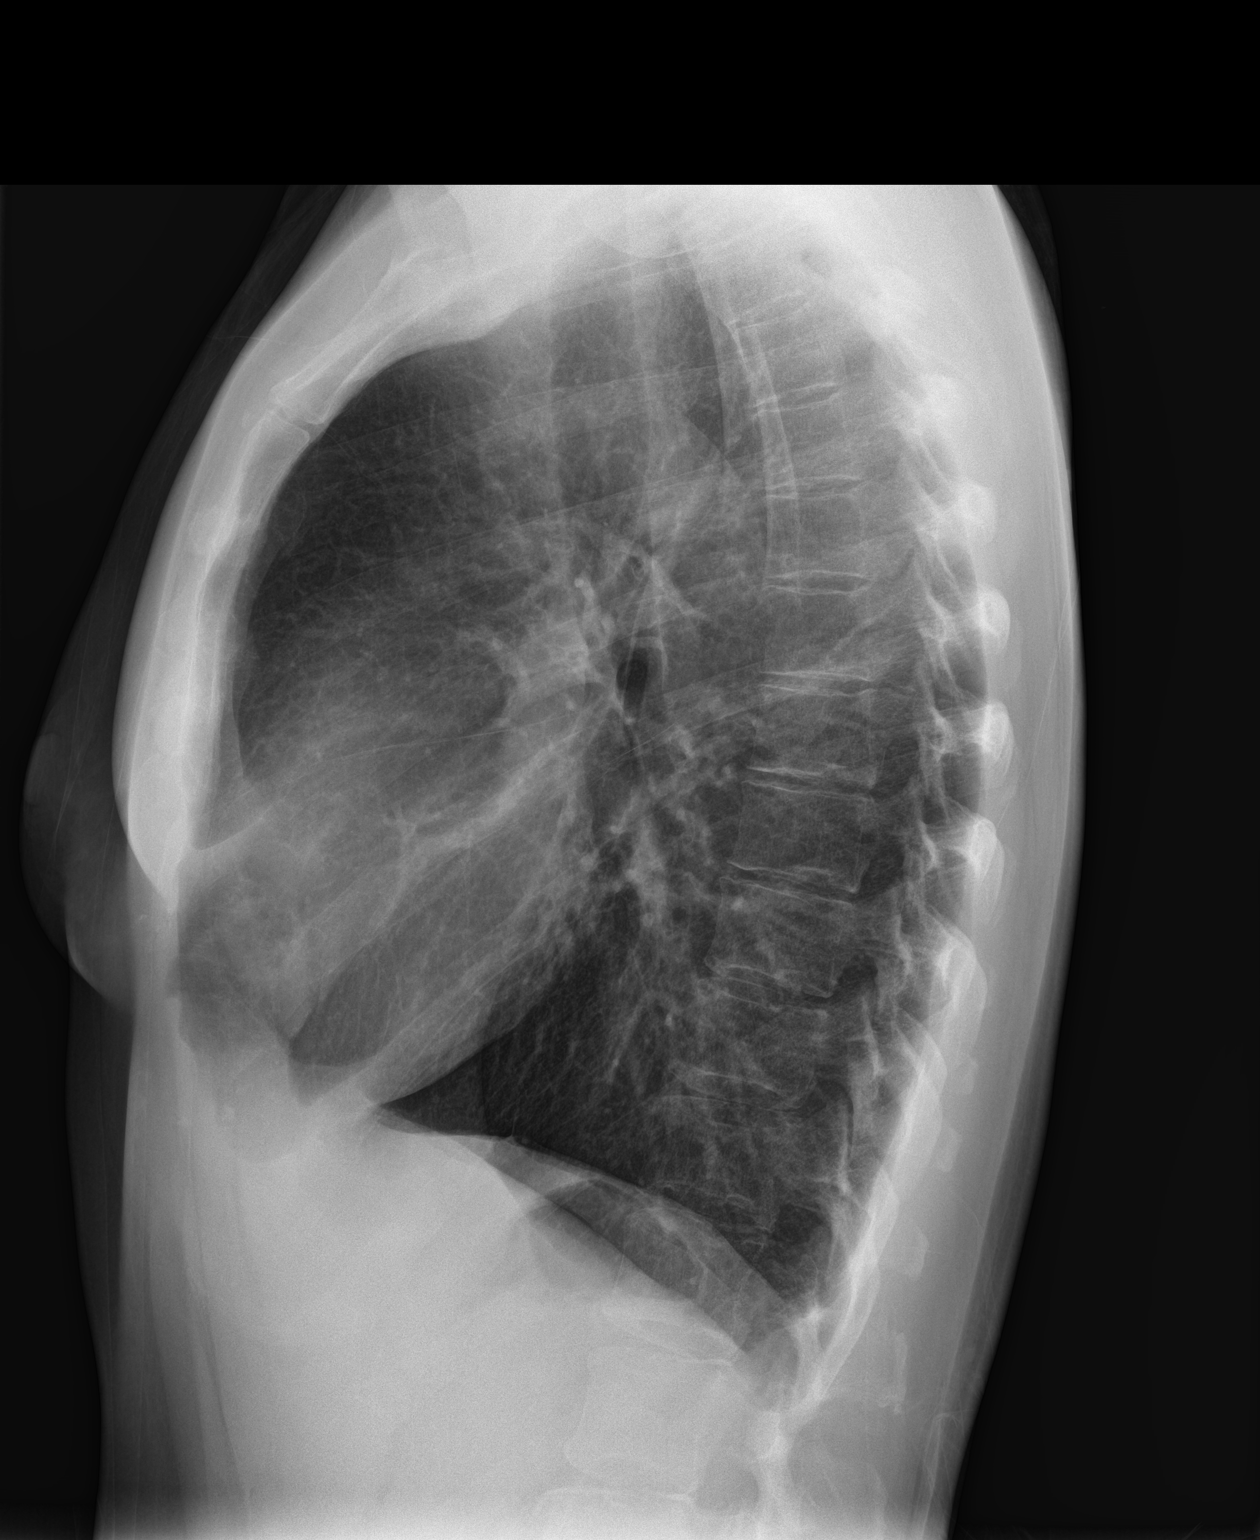

[2 of 2 positions shown; findings below may reference images not displayed]

FINDINGS: The mediastinal contours are within normal limits. No cardiomegaly.
The lungs are clear bilaterally without evidence of focal
consolidation, pleural effusion, or pneumothorax. No acute osseous
abnormality.
IMPRESSION: No acute cardiopulmonary process.

## 2023-10-06 ENCOUNTER — Ambulatory Visit (INDEPENDENT_AMBULATORY_CARE_PROVIDER_SITE_OTHER): Payer: Self-pay

## 2023-10-06 ENCOUNTER — Ambulatory Visit
Admission: RE | Admit: 2023-10-06 | Discharge: 2023-10-06 | Disposition: A | Discharge status: Home / Self Care | Payer: Self-pay | Attending: Family Medicine | Admitting: Family Medicine

## 2023-10-06 VITALS — BP 137/88 | HR 68 | Temp 98.2°F | Resp 16 | Wt 125.0 lb

## 2023-10-06 DIAGNOSIS — M79672 Pain in left foot: Secondary | ICD-10-CM

## 2023-10-06 DIAGNOSIS — S92512A Displaced fracture of proximal phalanx of left lesser toe(s), initial encounter for closed fracture: Secondary | ICD-10-CM

## 2023-10-06 NOTE — Discharge Instructions (Addendum)
 Take Tylenol or Motrin as needed for pain. Wear hard soled shoe for 4-6 weeks.  Follow up with foot and ankle specialist for fracture management.   Follow up with Dr Eva Gay  [address]  [facility] - Podiatry  [phone]   OR  Follow up with [facility] in [place].   Call [phone] [address] [place], [place] [address]

## 2023-10-06 NOTE — ED Provider Notes (Signed)
 [facility]    CSN: [id number] Arrival date & time: 10/06/23  0820      History   Chief Complaint Chief Complaint  Patient presents with   Foot Injury    Need X-ray to see if or what is possibly broken - Entered by patient    HPI  HPI Hannah Mayo is a 48 y.o. female.   Hannah Mayo presents for left foot pain that started 4 days ago.   She breaks her toes during martial arts all the time and this pain is worse.   Her 160 lb cane corso dog took off and my foot lagged behind.  She had immediate pain and now has bruising around the little toe.  Nothing taken for pain.      History reviewed. No pertinent past medical history.  There are no active problems to display for this patient.   Past Surgical History:  Procedure Laterality Date   KNEE SURGERY Right    KNEE SURGERY Left     OB History     Gravida  0   Para  0   Term  0   Preterm  0   AB  0   Living  0      SAB  0   IAB  0   Ectopic  0   Multiple  0   Live Births  0            Home Medications    Prior to Admission medications   Not on File    Family History Family History  Problem Relation Age of Onset   Healthy Mother    Hypertension Mother    Alcoholism Father    CAD Father    Breast cancer Maternal Grandmother    Bone cancer Maternal Grandmother     Social History Social History   Tobacco Use   Smoking status: Every Day    Types: Cigarettes   Smokeless tobacco: Never  Vaping Use   Vaping status: Never Used  Substance Use Topics   Alcohol use: No   Drug use: No     Allergies   Bee venom, Latex, and Sulfa antibiotics   Review of Systems Review of Systems: :negative unless otherwise stated in HPI.      Physical Exam Triage Vital Signs ED Triage Vitals  Encounter Vitals Group     BP      Girls Systolic BP Percentile      Girls Diastolic BP Percentile      Boys Systolic BP Percentile      Boys Diastolic BP Percentile      Pulse      Resp       Temp      Temp src      SpO2      Weight      Height      Head Circumference      Peak Flow      Pain Score      Pain Loc      Pain Education      Exclude from Growth Chart    No data found.  Updated Vital Signs BP 137/88 (BP Location: Right Arm)   Pulse 68   Temp 98.2 F (36.8 C) (Oral)   Resp 16   Wt 56.7 kg   LMP  (LMP Unknown)   SpO2 98%   BMI 20.18 kg/m   Visual Acuity Right Eye Distance:   Left Eye Distance:  Bilateral Distance:    Right Eye Near:   Left Eye Near:    Bilateral Near:     Physical Exam GEN: well appearing female in no acute distress  CVS: well perfused  RESP: speaking in full sentences without pause, no respiratory distress  MSK:   Ankle/Foot, Left: TTP noted at the distal 3rd-5th metatarsals and 4th and 5th toe. No visible erythema or bony deformity. + swelling, ecchymosis.  No notable pes planus deformity. Transverse arch grossly not intact;  No evidence of tibiotalar deviation; Range of motion is full in all directions. Strength is 5/5 in all directions. No tenderness at the insertion/body/myotendinous junction of the Achilles tendon; No tenderness on posterior aspects of lateral and medial malleolus; Unremarkable squeeze; Talar dome nontender; Unremarkable calcaneal squeeze; No plantar calcaneal tenderness; No tenderness over the navicular prominence or  over cuboid; No pain at base of 5th MT; Able to walk 4 steps.     UC Treatments / Results  Labs (all labs ordered are listed, but only abnormal results are displayed) Labs Reviewed - No data to display  EKG   Radiology DG Foot Complete Left Result Date: 10/06/2023 CLINICAL DATA:  3rd-5th distal MT and toe injury. EXAM: LEFT FOOT - COMPLETE 3+ VIEW COMPARISON:  12/10/2015. FINDINGS: There is an acute minimally displaced fracture along the proximal portion of the proximal phalanx of fifth toe. No intra-articular extension. No significant overlying soft tissue swelling. No other acute  fracture or dislocation. No aggressive osseous lesion. No significant arthritis of imaged joints. A tiny calcaneal spur noted along the Plantar aponeurosis attachment site. No radiopaque foreign bodies. IMPRESSION: Acute minimally displaced fracture along the proximal portion of the proximal phalanx of fifth toe. Electronically Signed   By: Ree Molt M.D.   On: 10/06/2023 09:13     Procedures Procedures (including critical care time)  Medications Ordered in UC Medications - No data to display  Initial Impression / Assessment and Plan / UC Course  I have reviewed the triage vital signs and the nursing notes.  Pertinent labs & imaging results that were available during my care of the patient were reviewed by me and considered in my medical decision making (see chart for details).      Pt is a 48 y.o.  female with 4 days of left foot pain after twisting her foot after her large dog ran off.  VSS.  Declined pain control.  On exam, pt has tenderness at distal 3rd-5th metatarsal and 4th and 5th toes with edema and ecchymosis concerning for fracture.   Obtained left foot plain films.  Personally interpreted by me were remarkable for 5th proximal toe fracture. Radiologist report reviewed and additionally notes acute minimally displaced fracture along the proximal portion of the proximal phalanx of the fifth toe.   Patient to gradually return to normal activities, as tolerated and continue ordinary activities within the limits permitted by pain. Motrin / Tylenol PRN. Patient placed in hard soled shoe.   Patient to follow up with orthopedic provider, if symptoms do not improve with conservative treatment.  Return and ED precautions given. Understanding voiced. Discussed MDM, treatment plan and plan for follow-up with patient who agrees with plan.   Final Clinical Impressions(s) / UC Diagnoses   Final diagnoses:  Left foot pain  Closed displaced fracture of proximal phalanx of lesser toe of  left foot, initial encounter     Discharge Instructions      Take Tylenol or Motrin as needed for pain.  Wear hard soled shoe for 4-6 weeks.  Follow up with foot and ankle specialist for fracture management.   Follow up with Dr Eva Gay  [address]  [facility] - Podiatry  [phone]   OR  Follow up with [facility] in [place].   Call [phone] [address] [place], [place] [address]       ED Prescriptions   None    PDMP not reviewed this encounter.   Neida Ellegood, DO 10/06/23 [phone]

## 2023-10-06 NOTE — ED Triage Notes (Signed)
 Pt presents with left foot pain x 4 days. Pt was pulled by her dog and twisted her foot. She has some bruising around her toes.
